# Patient Record
Sex: Female | Born: 1965 | Race: White | Hispanic: No | Marital: Married | Smoking: Never smoker
Health system: Southern US, Community
[De-identification: ages and names within clinical notes are randomized; demographics above are authoritative.]

## PROBLEM LIST (undated history)

## (undated) DIAGNOSIS — K219 Gastro-esophageal reflux disease without esophagitis: Secondary | ICD-10-CM

## (undated) DIAGNOSIS — M3500 Sicca syndrome, unspecified: Secondary | ICD-10-CM

## (undated) DIAGNOSIS — K589 Irritable bowel syndrome without diarrhea: Secondary | ICD-10-CM

## (undated) DIAGNOSIS — I341 Nonrheumatic mitral (valve) prolapse: Secondary | ICD-10-CM

## (undated) DIAGNOSIS — F32A Depression, unspecified: Secondary | ICD-10-CM

## (undated) DIAGNOSIS — F329 Major depressive disorder, single episode, unspecified: Secondary | ICD-10-CM

## (undated) DIAGNOSIS — G43909 Migraine, unspecified, not intractable, without status migrainosus: Secondary | ICD-10-CM

## (undated) HISTORY — DX: Nonrheumatic mitral (valve) prolapse: I34.1

## (undated) HISTORY — DX: Gastro-esophageal reflux disease without esophagitis: K21.9

## (undated) HISTORY — DX: Depression, unspecified: F32.A

## (undated) HISTORY — DX: Irritable bowel syndrome without diarrhea: K58.9

## (undated) HISTORY — DX: Migraine, unspecified, not intractable, without status migrainosus: G43.909

## (undated) HISTORY — DX: Sjogren syndrome, unspecified: M35.00

## (undated) HISTORY — DX: Major depressive disorder, single episode, unspecified: F32.9

---

## 1997-05-10 ENCOUNTER — Ambulatory Visit (HOSPITAL_COMMUNITY): Admission: RE | Admit: 1997-05-10 | Discharge: 1997-05-10 | Payer: Self-pay | Admitting: Gynecology

## 1997-05-11 ENCOUNTER — Ambulatory Visit (HOSPITAL_COMMUNITY): Admission: RE | Admit: 1997-05-11 | Discharge: 1997-05-11 | Payer: Self-pay | Admitting: Gynecology

## 1997-10-18 ENCOUNTER — Other Ambulatory Visit: Admission: RE | Admit: 1997-10-18 | Discharge: 1997-10-18 | Payer: Self-pay | Admitting: Obstetrics and Gynecology

## 1998-09-19 ENCOUNTER — Ambulatory Visit (HOSPITAL_COMMUNITY): Admission: RE | Admit: 1998-09-19 | Discharge: 1998-09-19 | Payer: Self-pay | Admitting: Gynecology

## 1998-11-03 ENCOUNTER — Ambulatory Visit (HOSPITAL_COMMUNITY): Admission: RE | Admit: 1998-11-03 | Discharge: 1998-11-03 | Payer: Self-pay | Admitting: Otolaryngology

## 1998-11-03 ENCOUNTER — Encounter: Payer: Self-pay | Admitting: Otolaryngology

## 1998-11-26 ENCOUNTER — Encounter (HOSPITAL_COMMUNITY): Admission: RE | Admit: 1998-11-26 | Discharge: 1999-02-24 | Payer: Self-pay | Admitting: Neurology

## 1998-12-17 ENCOUNTER — Other Ambulatory Visit: Admission: RE | Admit: 1998-12-17 | Discharge: 1998-12-17 | Payer: Self-pay | Admitting: Obstetrics and Gynecology

## 2000-04-13 ENCOUNTER — Other Ambulatory Visit: Admission: RE | Admit: 2000-04-13 | Discharge: 2000-04-13 | Payer: Self-pay | Admitting: Obstetrics and Gynecology

## 2001-06-29 ENCOUNTER — Other Ambulatory Visit: Admission: RE | Admit: 2001-06-29 | Discharge: 2001-06-29 | Payer: Self-pay | Admitting: Obstetrics and Gynecology

## 2002-07-19 ENCOUNTER — Other Ambulatory Visit: Admission: RE | Admit: 2002-07-19 | Discharge: 2002-07-19 | Payer: Self-pay | Admitting: Obstetrics and Gynecology

## 2003-08-01 ENCOUNTER — Other Ambulatory Visit: Admission: RE | Admit: 2003-08-01 | Discharge: 2003-08-01 | Payer: Self-pay | Admitting: Obstetrics and Gynecology

## 2004-05-07 ENCOUNTER — Ambulatory Visit: Payer: Self-pay | Admitting: Internal Medicine

## 2004-06-05 ENCOUNTER — Encounter: Admission: RE | Admit: 2004-06-05 | Discharge: 2004-06-05 | Payer: Self-pay | Admitting: Orthopedic Surgery

## 2004-08-04 ENCOUNTER — Ambulatory Visit: Payer: Self-pay | Admitting: Internal Medicine

## 2004-08-06 ENCOUNTER — Encounter: Payer: Self-pay | Admitting: Internal Medicine

## 2004-08-18 ENCOUNTER — Other Ambulatory Visit: Admission: RE | Admit: 2004-08-18 | Discharge: 2004-08-18 | Payer: Self-pay | Admitting: Obstetrics and Gynecology

## 2004-08-19 ENCOUNTER — Ambulatory Visit: Payer: Self-pay | Admitting: Internal Medicine

## 2004-10-15 ENCOUNTER — Ambulatory Visit: Payer: Self-pay | Admitting: Internal Medicine

## 2005-04-06 ENCOUNTER — Ambulatory Visit: Payer: Self-pay | Admitting: Internal Medicine

## 2005-05-14 ENCOUNTER — Ambulatory Visit: Payer: Self-pay | Admitting: Internal Medicine

## 2005-05-23 ENCOUNTER — Ambulatory Visit: Payer: Self-pay | Admitting: Family Medicine

## 2005-06-02 ENCOUNTER — Ambulatory Visit: Payer: Self-pay | Admitting: Internal Medicine

## 2005-08-24 ENCOUNTER — Emergency Department (HOSPITAL_COMMUNITY): Admission: EM | Admit: 2005-08-24 | Discharge: 2005-08-24 | Payer: Self-pay | Admitting: Emergency Medicine

## 2005-10-19 ENCOUNTER — Other Ambulatory Visit: Admission: RE | Admit: 2005-10-19 | Discharge: 2005-10-19 | Payer: Self-pay | Admitting: Obstetrics and Gynecology

## 2005-12-24 ENCOUNTER — Encounter: Admission: RE | Admit: 2005-12-24 | Discharge: 2005-12-24 | Payer: Self-pay | Admitting: Obstetrics and Gynecology

## 2006-03-05 ENCOUNTER — Emergency Department (HOSPITAL_COMMUNITY): Admission: EM | Admit: 2006-03-05 | Discharge: 2006-03-05 | Payer: Self-pay | Admitting: Emergency Medicine

## 2006-03-10 ENCOUNTER — Encounter: Admission: RE | Admit: 2006-03-10 | Discharge: 2006-03-10 | Payer: Self-pay | Admitting: Orthopedic Surgery

## 2006-03-30 HISTORY — PX: BREAST ENHANCEMENT SURGERY: SHX7

## 2006-04-16 ENCOUNTER — Ambulatory Visit: Payer: Self-pay | Admitting: Internal Medicine

## 2006-04-16 LAB — CONVERTED CEMR LAB
ANA Titer 1: 1:160 {titer} — ABNORMAL HIGH
CO2: 25 meq/L (ref 19–32)
Calcium: 9.4 mg/dL (ref 8.4–10.5)
Eosinophils Relative: 4 % (ref 0–5)
Glucose, Bld: 94 mg/dL (ref 70–99)
HCT: 35.5 % — ABNORMAL LOW (ref 36.0–46.0)
Hemoglobin: 11.5 g/dL — ABNORMAL LOW (ref 12.0–15.0)
Lymphocytes Relative: 20 % (ref 12–46)
Lymphs Abs: 1.6 10*3/uL (ref 0.7–3.3)
Monocytes Absolute: 1 10*3/uL — ABNORMAL HIGH (ref 0.2–0.7)
Platelets: 217 10*3/uL (ref 150–400)
Sodium: 140 meq/L (ref 135–145)
WBC: 8.2 10*3/uL (ref 4.0–10.5)

## 2006-07-02 ENCOUNTER — Encounter: Payer: Self-pay | Admitting: Internal Medicine

## 2006-11-15 ENCOUNTER — Other Ambulatory Visit: Admission: RE | Admit: 2006-11-15 | Discharge: 2006-11-15 | Payer: Self-pay | Admitting: Obstetrics and Gynecology

## 2006-11-24 ENCOUNTER — Encounter: Payer: Self-pay | Admitting: Internal Medicine

## 2006-12-23 ENCOUNTER — Encounter: Payer: Self-pay | Admitting: Internal Medicine

## 2006-12-23 DIAGNOSIS — I7300 Raynaud's syndrome without gangrene: Secondary | ICD-10-CM | POA: Insufficient documentation

## 2006-12-23 DIAGNOSIS — R799 Abnormal finding of blood chemistry, unspecified: Secondary | ICD-10-CM | POA: Insufficient documentation

## 2006-12-23 DIAGNOSIS — M3500 Sicca syndrome, unspecified: Secondary | ICD-10-CM | POA: Insufficient documentation

## 2007-02-16 ENCOUNTER — Other Ambulatory Visit: Admission: RE | Admit: 2007-02-16 | Discharge: 2007-02-16 | Payer: Self-pay | Admitting: Obstetrics and Gynecology

## 2007-05-10 ENCOUNTER — Encounter: Payer: Self-pay | Admitting: Internal Medicine

## 2007-05-24 ENCOUNTER — Ambulatory Visit: Payer: Self-pay | Admitting: Internal Medicine

## 2007-06-02 ENCOUNTER — Ambulatory Visit (HOSPITAL_COMMUNITY): Admission: RE | Admit: 2007-06-02 | Discharge: 2007-06-02 | Payer: Self-pay | Admitting: Rheumatology

## 2007-11-23 ENCOUNTER — Other Ambulatory Visit: Admission: RE | Admit: 2007-11-23 | Discharge: 2007-11-23 | Payer: Self-pay | Admitting: Obstetrics and Gynecology

## 2008-04-09 ENCOUNTER — Ambulatory Visit: Payer: Self-pay | Admitting: Internal Medicine

## 2008-04-09 DIAGNOSIS — B351 Tinea unguium: Secondary | ICD-10-CM | POA: Insufficient documentation
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
## 2010-04-27 LAB — CONVERTED CEMR LAB
Alkaline Phosphatase: 33 units/L — ABNORMAL LOW (ref 39–117)
Bilirubin, Direct: 0.1 mg/dL (ref 0.0–0.3)
Total Protein: 6.9 g/dL (ref 6.0–8.3)

## 2010-08-15 NOTE — Assessment & Plan Note (Signed)
[facility]                                 ON-CALL NOTE   Crystal Mclaughlin, Crystal Mclaughlin                       MRN:          [id number]  DATE:01/15/2007                            DOB:          November 05, 1965    PRIMARY CARE PHYSICIAN:  Dr. Fabian Sharp.   The patient is calling in because she had vaginitis. She had this  problem last week. She used Monistat on Sunday, Monday, and Tuesday. It  did not help at all. The symptoms are the same now as they were on  Wednesday and she is calling on Saturday. She is insisting on being  called in Diflucan.   I called in Diflucan 2 200 mg pills and I advised her in the future that  she would need to call the office during office hours for medications  like this.     Jeffrey A. Tawanna Cooler, MD  Electronically Signed    JAT/MedQ  DD: 01/16/2007  DT: 01/17/2007  Job #: [id number]

## 2013-04-13 ENCOUNTER — Encounter: Payer: Self-pay | Admitting: Obstetrics & Gynecology

## 2013-04-14 ENCOUNTER — Ambulatory Visit (INDEPENDENT_AMBULATORY_CARE_PROVIDER_SITE_OTHER): Payer: Managed Care, Other (non HMO) | Admitting: Obstetrics & Gynecology

## 2013-04-14 ENCOUNTER — Encounter: Payer: Self-pay | Admitting: Obstetrics & Gynecology

## 2013-04-14 VITALS — BP 102/64 | HR 64 | Resp 16 | Ht 64.75 in | Wt 161.0 lb

## 2013-04-14 DIAGNOSIS — L293 Anogenital pruritus, unspecified: Secondary | ICD-10-CM

## 2013-04-14 DIAGNOSIS — N898 Other specified noninflammatory disorders of vagina: Secondary | ICD-10-CM

## 2013-04-14 DIAGNOSIS — N760 Acute vaginitis: Secondary | ICD-10-CM

## 2013-04-14 MED ORDER — ESTRADIOL 0.1 MG/GM VA CREA: TOPICAL_CREAM | VAGINAL | Status: DC

## 2013-04-14 MED ORDER — FLUCONAZOLE 150 MG PO TABS: 150.0000 mg | ORAL_TABLET | Freq: Once | ORAL | Status: DC

## 2013-04-14 NOTE — Patient Instructions (Signed)
Please call if you symptoms do not resolve.

## 2013-04-14 NOTE — Progress Notes (Signed)
Subjective:     Patient ID: Crystal Mclaughlin, female   DOB: 11/17/1965, 48 y.o.   MRN: [id number]  HPI 48 yo G3P3 here for increased vaginal discharge and itching for five days.  Has a tooth abscess and had a root canal on Wed.  She was initially placed on Clindamycin and she had a lot of diarrhea with this.  She was switched to penicillin and will be on this for five more days.  Discharge is not too significant.  Diarrhea has fully resolved.  No urinary symptoms.    Patient's hx reviewed.  She was on HRT 7/13.  She has chest pain and went to hospital.  Was kept overnight.  Cardiac eval was negative but she stopped her estrogen.  Now having a lot of dryness symptoms and pain with intercourse.   Review of Systems  All other systems reviewed and are negative.       Objective:   Physical Exam  Constitutional: She is oriented to person, place, and time. She appears well-developed and well-nourished.  Abdominal: Soft. Bowel sounds are normal.  Genitourinary: Uterus normal. There is no rash or tenderness on the right labia. There is no rash or tenderness on the left labia. Cervix exhibits discharge. There is bleeding around the vagina. Vaginal discharge found.  Musculoskeletal: Normal range of motion.  Lymphadenopathy:       Right: No inguinal adenopathy present.       Left: No inguinal adenopathy present.  Neurological: She is alert and oriented to person, place, and time.  Skin: Skin is warm and dry.  Psychiatric: She has a normal mood and affect. Her behavior is normal.   Wet smear:  Ph 4.5.  Saline with occ WBCs.  KOH with -whiff, +Candida    Assessment:     Vaginal discharge Vaginal atrophy     Plan:     Diflucan rx to pharamcy Estrace vaginal cream 1 gram pv twice weekly.  Rx to pharmacy.  Pt KNOWS if she has any cardiac symptoms that she should stop immediately and be seen at urgent care.  Voices clear understanding.

## 2013-04-17 ENCOUNTER — Telehealth: Payer: Self-pay | Admitting: *Deleted

## 2013-04-17 NOTE — Telephone Encounter (Signed)
Does she have a pharmacy plan with her insurance.  I do not know what her medications will cost.  She can see if Vagifem, Estrace vaginal cream (not the pills), or Premarin cream is cheaper.  I can prescribe whichever one is cheaper.  The Estrace will last her 4 1/2 months so that works out to about $40/month.

## 2013-04-17 NOTE — Telephone Encounter (Addendum)
Estrace 0.1 mg Vaginal cream prescribed for pt on 04/14/2013.  Incoming fax from pharmacy stating,  Estrace costs pt $200. Pt is asking for cheaper alternative.  Please advise.

## 2013-04-17 NOTE — Telephone Encounter (Signed)
LM for pt to call back regarding her medication.

## 2013-04-17 NOTE — Telephone Encounter (Signed)
Called pharmacy. They don't know which one would be cheaper for pt. Will call pt so she can call her insurance and ask them.

## 2013-04-20 NOTE — Telephone Encounter (Signed)
S/w pt and she states she had change insurance plans and wasn't aware of new deductible. She went back to pharmacy and got rx refilled.

## 2013-04-20 NOTE — Telephone Encounter (Signed)
LM for pt to call back. Second attempt  

## 2013-08-18 ENCOUNTER — Ambulatory Visit (INDEPENDENT_AMBULATORY_CARE_PROVIDER_SITE_OTHER): Payer: Managed Care, Other (non HMO) | Admitting: Obstetrics & Gynecology

## 2013-08-18 ENCOUNTER — Encounter: Payer: Self-pay | Admitting: Obstetrics & Gynecology

## 2013-08-18 VITALS — BP 104/60 | HR 60 | Resp 16 | Ht 64.75 in | Wt 159.4 lb

## 2013-08-18 DIAGNOSIS — Z0000 Encounter for general adult medical examination without abnormal findings: Secondary | ICD-10-CM

## 2013-08-18 DIAGNOSIS — Z01419 Encounter for gynecological examination (general) (routine) without abnormal findings: Secondary | ICD-10-CM

## 2013-08-18 DIAGNOSIS — Z124 Encounter for screening for malignant neoplasm of cervix: Secondary | ICD-10-CM

## 2013-08-18 LAB — POCT URINALYSIS DIPSTICK
Bilirubin, UA: NEGATIVE
Blood, UA: NEGATIVE
Glucose, UA: NEGATIVE
Ketones, UA: NEGATIVE
LEUKOCYTES UA: NEGATIVE
NITRITE UA: NEGATIVE
PH UA: 5
PROTEIN UA: NEGATIVE
UROBILINOGEN UA: NEGATIVE

## 2013-08-18 MED ORDER — ESTRADIOL 0.1 MG/GM VA CREA: TOPICAL_CREAM | VAGINAL | Status: DC

## 2013-08-18 NOTE — Progress Notes (Signed)
48 y.o. G3P3 MarriedCaucasianF here for annual exam.  Really feels the vaginal estrogen has helped the vaginal dryness.  No vaginal bleeding.  Feeling a little bloated at times with this.    Frustrated with weight.  Is 30 pounds heavier than compared to 2007.  She wants to use HCG injections.  Asks if we do this.  Advised we do not do this.  PCP:  Lupita Leash, MD  Used care everywhere to access recent labs, ECG, Echo.    Patient's last menstrual period was 01/28/2010.          Sexually active: no  The current method of family planning is vasectomy.    Exercising: no  not regularly Smoker:  no  Health Maintenance: Pap:  01/02/10 WNL History of abnormal Pap:  no MMG:  10/12/11-normal Colonoscopy:  1997 BMD:   1/15 heel test-moderate TDaP:  12/06 with PCP Screening Labs: PCP, Hb today: n/a, Urine today: negative   reports that she has never smoked. She has never used smokeless tobacco. She reports that she does not drink alcohol or use illicit drugs.  Past Medical History  Diagnosis Date  . IBS (irritable bowel syndrome)   . Migraines   . Depression   . MVP (mitral valve prolapse)   . Acid reflux   . Sjogren's syndrome     Past Surgical History  Procedure Laterality Date  . Breast enhancement surgery  2008    saline    Current Outpatient Prescriptions  Medication Sig Dispense Refill  . ALPRAZolam (XANAX) 0.25 MG tablet 0.25 mg as needed.      Marland Kitchen estradiol (ESTRACE) 0.1 MG/GM vaginal cream 1 gram vaginally twice weekly  42.5 g  1  . Pantoprazole Sodium (PROTONIX PO) Take by mouth daily.       No current facility-administered medications for this visit.    Family History  Problem Relation Age of Onset  . Heart attack Father     ROS:  Pertinent items are noted in HPI.  Otherwise, a comprehensive ROS was negative.  Exam:   BP 104/60  Pulse 60  Resp 16  Ht 5' 4.75" (1.645 m)  Wt 159 lb 6.4 oz (72.303 kg)  BMI 26.72 kg/m2  LMP 01/28/2010   Height: 5' 4.75" (164.5  cm)  Ht Readings from Last 3 Encounters:  08/18/13 5' 4.75" (1.645 m)  04/14/13 5' 4.75" (1.645 m)    General appearance: alert, cooperative and appears stated age Head: Normocephalic, without obvious abnormality, atraumatic Neck: no adenopathy, supple, symmetrical, trachea midline and thyroid normal to inspection and palpation Lungs: clear to auscultation bilaterally Breasts: normal appearance, no masses or tenderness Heart: regular rate and rhythm Abdomen: soft, non-tender; bowel sounds normal; no masses,  no organomegaly Extremities: extremities normal, atraumatic, no cyanosis or edema Skin: Skin color, texture, turgor normal. No rashes or lesions Lymph nodes: Cervical, supraclavicular, and axillary nodes normal. No abnormal inguinal nodes palpated Neurologic: Grossly normal   Pelvic: External genitalia:  no lesions              Urethra:  normal appearing urethra with no masses, tenderness or lesions              Bartholins and Skenes: normal                 Vagina: normal appearing vagina with normal color and discharge, no lesions              Cervix: absent  Pap taken: yes Bimanual Exam:  Uterus:  normal size, contour, position, consistency, mobility, non-tender              Adnexa: normal adnexa and no mass, fullness, tenderness               Rectovaginal: Confirms               Anus:  normal sphincter tone, no lesions  A:  Well Woman with normal exam Sjogren's  Desired weight loss Vaginal dryness, improved with estrace  P:   mammogram pap smear Continue estrace vaginal cream 1 gram pv one to two times weekly. return annually or prn  An After Visit Summary was printed and given to the patient.

## 2013-08-18 NOTE — Addendum Note (Signed)
Addended by: Jerene Bears on: 08/18/2013 03:08 PM   Modules accepted: Orders

## 2013-08-18 NOTE — Patient Instructions (Signed)

## 2013-08-23 LAB — IPS PAP TEST WITH HPV

## 2014-01-29 ENCOUNTER — Encounter: Payer: Self-pay | Admitting: Obstetrics & Gynecology

## 2014-10-18 ENCOUNTER — Ambulatory Visit: Payer: Managed Care, Other (non HMO) | Admitting: Obstetrics & Gynecology

## 2014-10-18 ENCOUNTER — Encounter: Payer: Self-pay | Admitting: Obstetrics and Gynecology

## 2014-10-18 ENCOUNTER — Ambulatory Visit (INDEPENDENT_AMBULATORY_CARE_PROVIDER_SITE_OTHER): Payer: Managed Care, Other (non HMO) | Admitting: Obstetrics and Gynecology

## 2014-10-18 VITALS — BP 100/66 | HR 56 | Resp 14 | Ht 65.0 in | Wt 149.0 lb

## 2014-10-18 DIAGNOSIS — R6882 Decreased libido: Secondary | ICD-10-CM

## 2014-10-18 DIAGNOSIS — Z23 Encounter for immunization: Secondary | ICD-10-CM

## 2014-10-18 DIAGNOSIS — Z0000 Encounter for general adult medical examination without abnormal findings: Secondary | ICD-10-CM | POA: Diagnosis not present

## 2014-10-18 DIAGNOSIS — Z01419 Encounter for gynecological examination (general) (routine) without abnormal findings: Secondary | ICD-10-CM | POA: Diagnosis not present

## 2014-10-18 LAB — POCT URINALYSIS DIPSTICK
Bilirubin, UA: NEGATIVE
Blood, UA: NEGATIVE
GLUCOSE UA: NEGATIVE
Ketones, UA: NEGATIVE
Leukocytes, UA: NEGATIVE
NITRITE UA: NEGATIVE
Protein, UA: NEGATIVE
UROBILINOGEN UA: NEGATIVE
pH, UA: 5

## 2014-10-18 LAB — HEMOGLOBIN, FINGERSTICK: HEMOGLOBIN, FINGERSTICK: 11.7 g/dL — AB (ref 12.0–16.0)

## 2014-10-18 LAB — CBC
HCT: 35.6 % — ABNORMAL LOW (ref 36.0–46.0)
Hemoglobin: 12 g/dL (ref 12.0–15.0)
MCH: 31.3 pg (ref 26.0–34.0)
MCHC: 33.7 g/dL (ref 30.0–36.0)
MCV: 93 fL (ref 78.0–100.0)
MPV: 9.3 fL (ref 8.6–12.4)
PLATELETS: 207 10*3/uL (ref 150–400)
RBC: 3.83 MIL/uL — ABNORMAL LOW (ref 3.87–5.11)
RDW: 13.7 % (ref 11.5–15.5)
WBC: 6 10*3/uL (ref 4.0–10.5)

## 2014-10-18 LAB — COMPREHENSIVE METABOLIC PANEL
ALBUMIN: 4.3 g/dL (ref 3.5–5.2)
ALK PHOS: 52 U/L (ref 39–117)
ALT: 27 U/L (ref 0–35)
AST: 24 U/L (ref 0–37)
BUN: 16 mg/dL (ref 6–23)
CO2: 24 mEq/L (ref 19–32)
CREATININE: 0.85 mg/dL (ref 0.50–1.10)
Calcium: 9.9 mg/dL (ref 8.4–10.5)
Chloride: 105 mEq/L (ref 96–112)
GLUCOSE: 78 mg/dL (ref 70–99)
Potassium: 4.1 mEq/L (ref 3.5–5.3)
Sodium: 141 mEq/L (ref 135–145)
Total Bilirubin: 0.5 mg/dL (ref 0.2–1.2)
Total Protein: 7 g/dL (ref 6.0–8.3)

## 2014-10-18 LAB — LIPID PANEL
CHOL/HDL RATIO: 3.3 ratio
CHOLESTEROL: 171 mg/dL (ref 0–200)
HDL: 52 mg/dL (ref >=46)
LDL Cholesterol: 104 mg/dL — ABNORMAL HIGH (ref 0–99)
TRIGLYCERIDES: 75 mg/dL (ref <150)
VLDL: 15 mg/dL (ref 0–40)

## 2014-10-18 LAB — TSH: TSH: 2.198 u[IU]/mL (ref 0.350–4.500)

## 2014-10-18 MED ORDER — ESTRADIOL 0.1 MG/GM VA CREA: TOPICAL_CREAM | VAGINAL | Status: AC

## 2014-10-18 NOTE — Patient Instructions (Signed)

## 2014-10-18 NOTE — Progress Notes (Signed)
Patient ID: Crystal Mclaughlin, female   DOB: [birth year], 49 y.o.   MRN: [id number] 49 y.o. G3P3 Married Caucasian female here for annual exam.    Cares for mother who has dementia. Will de renting her mother's home.  Father also had Alzheimer's.  No longer living.  Patient lives in [place] and returns her for her exams.   Just started biking!  No menses for 5 years.  Used vaginal estrogen cream, and it worked well.  Was having some pains in her chest and so stopped this.  Not sure if the two were related. Also under stress due to family issues.  Vagina verry dry and "does not feel like a woman"  Due the dryness. Not having sexual activity.  Has low libido.  Some lower extremity edema.  Stopped seeing Rheumatology.  Told she has venous insufficiency and has compression hose, which she does not like.   Father and mother with hx CVD.  PCP:  Lupita Leash, MD    Patient's last menstrual period was 03/30/2009 (approximate).          Sexually active: No.  The current method of family planning is post menopausal status.    Exercising: Yes.    biking. Smoker:  no  Health Maintenance: Pap:  08-18-13 wnl:neg HR HPV History of abnormal Pap:  no MMG:  08-18-13 Saline implants/Density Cat:C/Neg:[facility] Colonoscopy:  06/2014 normal - done due to constipation. BMD:   n/a  Result  n/a TDaP:  02/2005 Screening Labs:  Hb today: 11.7, Urine today: Neg  reports that she has never smoked. She has never used smokeless tobacco. She reports that she does not drink alcohol or use illicit drugs.  Past Medical History  Diagnosis Date  . IBS (irritable bowel syndrome)   . Migraines   . Depression   . MVP (mitral valve prolapse)   . Acid reflux   . Sjogren's syndrome     Past Surgical History  Procedure Laterality Date  . Breast enhancement surgery  2008    saline    Current Outpatient Prescriptions  Medication Sig Dispense Refill  . ALPRAZolam (XANAX) 0.25 MG tablet 0.25 mg as needed.    .  Pantoprazole Sodium (PROTONIX PO) Take by mouth daily.     No current facility-administered medications for this visit.    Family History  Problem Relation Age of Onset  . Heart attack Father   . Dementia Mother   . Aneurysm Mother   . Stroke Mother     ROS:  Pertinent items are noted in HPI.  Otherwise, a comprehensive ROS was negative.  Exam:   BP 100/66 mmHg  Pulse 56  Resp 14  Ht  (1.651 m)  Wt 149 lb (67.586 kg)  BMI 24.79 kg/m2  LMP 03/30/2009 (Approximate)    General appearance: alert, cooperative and appears stated age Head: Normocephalic, without obvious abnormality, atraumatic Neck: no adenopathy, supple, symmetrical, trachea midline and thyroid normal to inspection and palpation Lungs: clear to auscultation bilaterally Breasts: normal appearance, no masses or tenderness, Inspection negative, No nipple retraction or dimpling, No nipple discharge or bleeding, No axillary or supraclavicular adenopathy, bilateral breast implants. Heart: regular rate and rhythm Abdomen: soft, non-tender; bowel sounds normal; no masses,  no organomegaly Extremities: extremities normal, atraumatic, no cyanosis or edema Skin: Skin color, texture, turgor normal. No rashes or lesions Lymph nodes: Cervical, supraclavicular, and axillary nodes normal. No abnormal inguinal nodes palpated Neurologic: Grossly normal  Pelvic: External genitalia:  no lesions  Urethra:  normal appearing urethra with no masses, tenderness or lesions              Bartholins and Skenes: normal                 Vagina: normal appearing vagina with normal color and discharge, scattered petechiae of the vagina and cervix.  Vaginal mucosa is pale pink.              Cervix: no lesions              Pap taken: No. Bimanual Exam:  Uterus:  normal size, contour, position, consistency, mobility, non-tender              Adnexa: normal adnexa and no mass, fullness, tenderness              Rectovaginal: Yes.  .   Confirms.              Anus:  normal sphincter tone, no lesions  Chaperone was present for exam.  Assessment:   Well woman visit with normal exam. Vaginal atrophy.  Decreased libido.  FH of CVD and dementia.  Plan: Yearly mammogram recommended after age 20.   Patient will call to schedule with [facility]. Recommended self breast exam.  Pap and HR HPV as above. Discussed Calcium, Vitamin D, probiotics regular exercise program including cardiovascular exercise. Labs performed.  Yes.  .   See orders. Routine labs and testosterone.  Refills given on medications.  Yes.  .  See orders.  Estrace cream.  Will lower dose to 1/2 gram pv at hs and then twice weekly.   Discussed low risk of CV events.  Knows she needs to complete her mammogram! I recommend consultation with cardiology regarding prevention of first events and counseling regarding family history.   She will persue this in [place]. TDap. Follow up annually and prn.   Additional counseling given regarding atrophic vaginitis and decreased libido.  I did discuss the potential to use testosterone supplementation and the risks involved.  After visit summary provided.

## 2014-10-19 LAB — TESTOSTERONE: Testosterone: 29 ng/dL (ref 10–70)

## 2014-10-24 ENCOUNTER — Encounter: Payer: Self-pay | Admitting: Obstetrics & Gynecology

## 2015-11-22 ENCOUNTER — Ambulatory Visit: Payer: Managed Care, Other (non HMO) | Admitting: Obstetrics and Gynecology

## 2023-10-15 ENCOUNTER — Encounter: Payer: Self-pay | Admitting: Advanced Practice Midwife
